# Patient Record
Sex: Male | Born: 1965 | Race: White | Hispanic: No | Marital: Married | Smoking: Current every day smoker
Health system: Southern US, Community
[De-identification: ages and names within clinical notes are randomized; demographics above are authoritative.]

## PROBLEM LIST (undated history)

## (undated) DIAGNOSIS — F419 Anxiety disorder, unspecified: Secondary | ICD-10-CM

## (undated) DIAGNOSIS — G4700 Insomnia, unspecified: Secondary | ICD-10-CM

## (undated) HISTORY — DX: Insomnia, unspecified: G47.00

## (undated) HISTORY — DX: Anxiety disorder, unspecified: F41.9

---

## 1999-09-14 ENCOUNTER — Encounter: Payer: Self-pay | Admitting: Family Medicine

## 1999-09-14 ENCOUNTER — Ambulatory Visit (HOSPITAL_COMMUNITY): Admission: RE | Admit: 1999-09-14 | Discharge: 1999-09-14 | Payer: Self-pay | Admitting: Family Medicine

## 2007-11-30 ENCOUNTER — Encounter: Payer: Self-pay | Admitting: Gastroenterology

## 2008-01-06 ENCOUNTER — Ambulatory Visit: Payer: Self-pay | Admitting: Gastroenterology

## 2008-01-06 DIAGNOSIS — R198 Other specified symptoms and signs involving the digestive system and abdomen: Secondary | ICD-10-CM

## 2008-01-10 ENCOUNTER — Ambulatory Visit: Payer: Self-pay | Admitting: Gastroenterology

## 2008-07-29 ENCOUNTER — Emergency Department (HOSPITAL_COMMUNITY): Admission: EM | Admit: 2008-07-29 | Discharge: 2008-07-29 | Payer: Self-pay | Admitting: Emergency Medicine

## 2011-12-22 ENCOUNTER — Ambulatory Visit (HOSPITAL_COMMUNITY)
Admission: RE | Admit: 2011-12-22 | Discharge: 2011-12-22 | Disposition: A | Discharge status: Home / Self Care | Payer: Worker's Compensation | Source: Ambulatory Visit | Attending: Family Medicine | Admitting: Family Medicine

## 2011-12-22 ENCOUNTER — Other Ambulatory Visit: Payer: Self-pay | Admitting: Family Medicine

## 2011-12-22 DIAGNOSIS — R51 Headache: Secondary | ICD-10-CM

## 2011-12-22 DIAGNOSIS — R52 Pain, unspecified: Secondary | ICD-10-CM

## 2011-12-22 DIAGNOSIS — M25519 Pain in unspecified shoulder: Secondary | ICD-10-CM | POA: Insufficient documentation

## 2011-12-22 DIAGNOSIS — S0990XA Unspecified injury of head, initial encounter: Secondary | ICD-10-CM | POA: Insufficient documentation

## 2011-12-22 DIAGNOSIS — X58XXXA Exposure to other specified factors, initial encounter: Secondary | ICD-10-CM | POA: Insufficient documentation

## 2012-09-20 ENCOUNTER — Ambulatory Visit (INDEPENDENT_AMBULATORY_CARE_PROVIDER_SITE_OTHER): Payer: BC Managed Care – PPO | Admitting: Family Medicine

## 2012-09-20 ENCOUNTER — Encounter: Payer: Self-pay | Admitting: Family Medicine

## 2012-09-20 VITALS — BP 150/85 | HR 91 | Temp 97.3°F | Ht 70.0 in | Wt 216.0 lb

## 2012-09-20 DIAGNOSIS — K219 Gastro-esophageal reflux disease without esophagitis: Secondary | ICD-10-CM

## 2012-09-20 DIAGNOSIS — F17200 Nicotine dependence, unspecified, uncomplicated: Secondary | ICD-10-CM

## 2012-09-20 DIAGNOSIS — N529 Male erectile dysfunction, unspecified: Secondary | ICD-10-CM

## 2012-09-20 DIAGNOSIS — Z0000 Encounter for general adult medical examination without abnormal findings: Secondary | ICD-10-CM

## 2012-09-20 DIAGNOSIS — G4700 Insomnia, unspecified: Secondary | ICD-10-CM

## 2012-09-20 LAB — POCT CBC
Granulocyte percent: 29.5 %G — AB (ref 37–80)
HCT, POC: 47.7 % (ref 43.5–53.7)
Hemoglobin: 16 g/dL (ref 14.1–18.1)
Lymph, poc: 3.1 (ref 0.6–3.4)
MCH, POC: 30.3 pg (ref 27–31.2)
MCHC: 33.5 g/dL (ref 31.8–35.4)
MCV: 90.2 fL (ref 80–97)
MPV: 8.3 fL (ref 0–99.8)
POC Granulocyte: 7.1 — AB (ref 2–6.9)
POC LYMPH PERCENT: 29.5 %L (ref 10–50)
Platelet Count, POC: 267 10*3/uL (ref 142–424)
RBC: 5.3 M/uL (ref 4.69–6.13)
RDW, POC: 13.4 %
WBC: 10.6 10*3/uL — AB (ref 4.6–10.2)

## 2012-09-20 LAB — COMPLETE METABOLIC PANEL WITH GFR
ALT: 21 U/L (ref 0–53)
AST: 13 U/L (ref 0–37)
Albumin: 4.3 g/dL (ref 3.5–5.2)
Alkaline Phosphatase: 80 U/L (ref 39–117)
BUN: 6 mg/dL (ref 6–23)
CO2: 29 mEq/L (ref 19–32)
Calcium: 9.5 mg/dL (ref 8.4–10.5)
Chloride: 102 mEq/L (ref 96–112)
Creat: 0.91 mg/dL (ref 0.50–1.35)
GFR, Est African American: >89 mL/min
GFR, Est Non African American: >89 mL/min
Glucose, Bld: 83 mg/dL (ref 70–99)
Potassium: 4.7 mEq/L (ref 3.5–5.3)
Sodium: 140 mEq/L (ref 135–145)
Total Bilirubin: 0.5 mg/dL (ref 0.3–1.2)
Total Protein: 7 g/dL (ref 6.0–8.3)

## 2012-09-20 LAB — LIPID PANEL
Cholesterol: 207 mg/dL — ABNORMAL HIGH (ref 0–200)
HDL: 34 mg/dL — ABNORMAL LOW (ref >39)
LDL Cholesterol: 128 mg/dL — ABNORMAL HIGH (ref 0–99)
Total CHOL/HDL Ratio: 6.1 Ratio
Triglycerides: 224 mg/dL — ABNORMAL HIGH (ref <150)
VLDL: 45 mg/dL — ABNORMAL HIGH (ref 0–40)

## 2012-09-20 NOTE — Patient Instructions (Signed)

## 2012-09-20 NOTE — Progress Notes (Signed)
  Subjective:    Patient ID: Matthew Whitehead, male    DOB: May 26, 1965, 47 y.o.   MRN: [id number]  HPI  This 47 y.o. male presents for evaluation of fatigue and check up.  He has not been seen in over a year  and he has not had any labs.  He c/o low energy.  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  He states he has been having increased anxiety and has sleeping difficulties. He has been having some GERD and also ED sx's.  He is still smoking.  Review of Systems C/o GERD, ED, Insomnia, and fatigue. No chest pain, SOB, HA, dizziness, vision change, N/V, diarrhea, constipation, dysuria, urinary urgency or frequency, myalgias, arthralgias or rash.     Objective:   Physical Exam Vital signs noted  Well developed well nourished male.  HEENT - Head atraumatic Normocephalic                Eyes - PERRLA, Conjuctiva - clear Sclera- Clear EOMI                Ears - EAC's Wnl TM's Wnl Gross Hearing WNL                Nose - Nares patent                 Throat - oropharanx wnl Respiratory - Lungs CTA bilateral Cardiac - RRR S1 and S2 without murmur GI - Abdomen soft Nontender and bowel sounds active x 4 Rectal - Patient defers. Extremities - No edema. Neuro - Grossly intact.       Assessment & Plan:  Routine general medical examination at a health care facility - Plan: POCT CBC, Lipid panel, TSH, PSA, COMPLETE METABOLIC PANEL WITH GFR, Testosterone, Total & Free Direct  Erectile dysfunction - Viagra 100mg  one half to one po qd prn sex #6 w/11rf  GERD (gastroesophageal reflux disease) - Omeprazole 20mg  one po qd #30w/11  Smoking addiction - Discussed quitting smoking.  Wellbutrin XL 150mg  one po qd #30w/11  Insomnia - Trazadone 50mg  one to two po qhs prn insomnia #60w/3rf, Wellbutrin XL 150mg  one po qd #30w/11.  Discussed sleep hygiene.  Fatigue - Probably due to insomnia.  Check labs, follow up in one month.  Spent over 30  minutes with patient in visit.  Follow up in one month.

## 2012-09-20 NOTE — Addendum Note (Signed)
Addended by: Orma Render F on: 09/20/2012 04:09 PM   Modules accepted: Orders

## 2012-09-21 LAB — TESTOSTERONE, TOTAL AND FREE DIRECT MEASURE
Free Testosterone, Direct: 3.2 pg/mL — ABNORMAL LOW (ref 3.8–34.2)
Testosterone: 237 ng/dL — ABNORMAL LOW (ref 300–890)

## 2012-09-21 LAB — PSA: PSA: 4.08 ng/mL — ABNORMAL HIGH (ref <=4.00)

## 2012-09-21 LAB — TSH: TSH: 2.645 u[IU]/mL (ref 0.350–4.500)

## 2012-09-24 ENCOUNTER — Other Ambulatory Visit: Payer: Self-pay | Admitting: Family Medicine

## 2012-09-24 DIAGNOSIS — R972 Elevated prostate specific antigen [PSA]: Secondary | ICD-10-CM

## 2012-09-24 DIAGNOSIS — E785 Hyperlipidemia, unspecified: Secondary | ICD-10-CM

## 2012-09-24 MED ORDER — CIPROFLOXACIN HCL 500 MG PO TABS: 500.0000 mg | ORAL_TABLET | Freq: Two times a day (BID) | ORAL | Status: DC

## 2012-09-24 MED ORDER — PRAVASTATIN SODIUM 20 MG PO TABS: 20.0000 mg | ORAL_TABLET | Freq: Every day | ORAL | Status: DC

## 2012-09-27 ENCOUNTER — Other Ambulatory Visit: Payer: Self-pay | Admitting: Family Medicine

## 2012-09-29 ENCOUNTER — Other Ambulatory Visit: Payer: Self-pay | Admitting: Family Medicine

## 2013-02-09 IMAGING — CT CT HEAD W/O CM
1 series · 16 of 30 positions shown, 20 images · non-contrast
Comparison: None.

CLINICAL DATA: Headache post blunt trauma.

CT HEAD WITHOUT CONTRAST
TECHNIQUE: Contiguous axial images were obtained from the base of
the skull through the vertex without contrast.

[Series 2: headtrauma 4.8 h37s · axial · 0.46mm/px · z∈[+1125,+1283]mm · 16 of 36 slices shown, 20 images]
[im 2/36  brain]
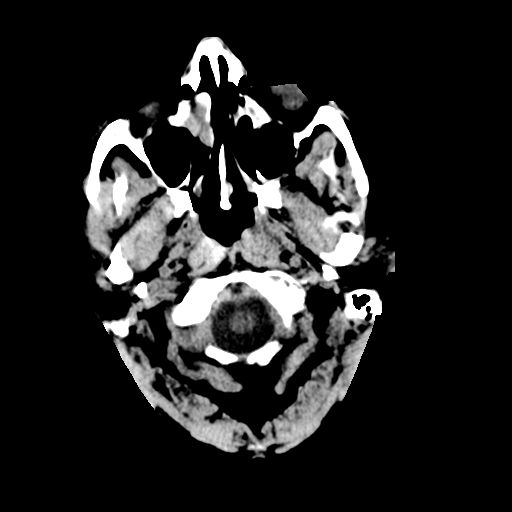
[im 2/36  bone]
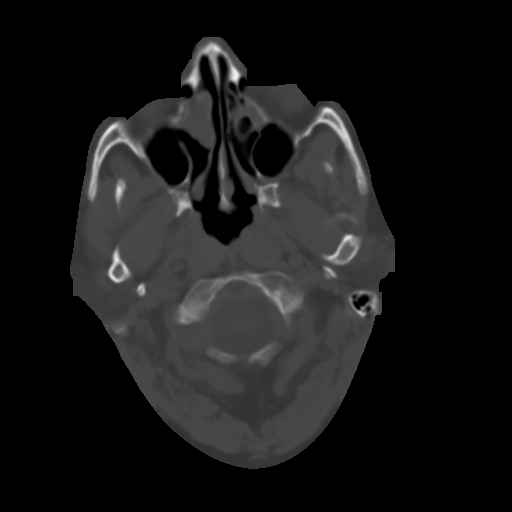
[im 4/36  brain]
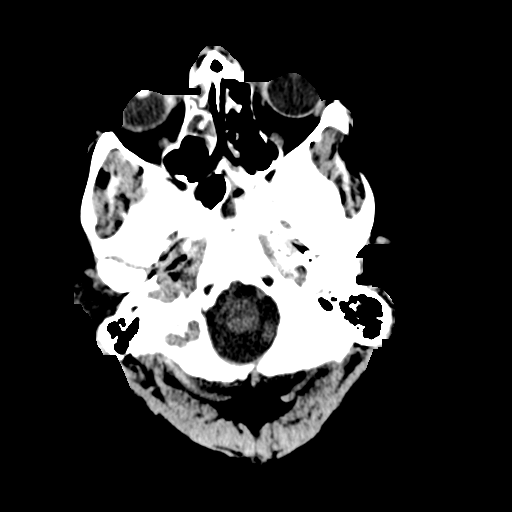
[im 7/36  brain]
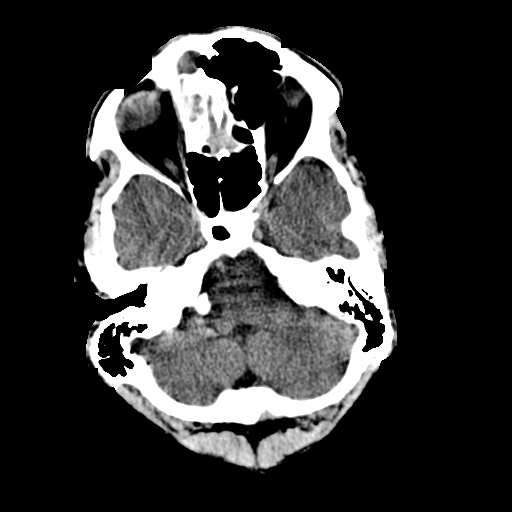
[im 9/36  brain]
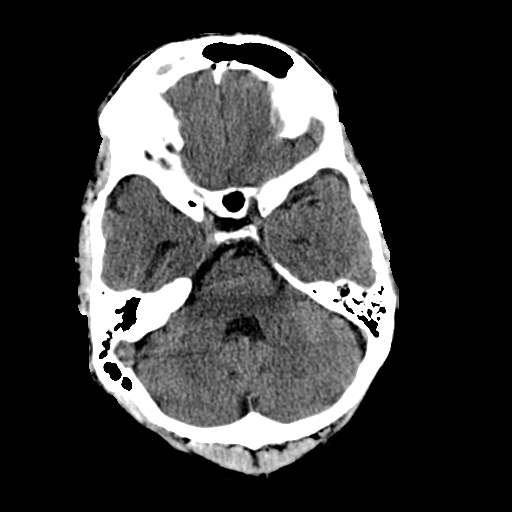
[im 10/36  brain]
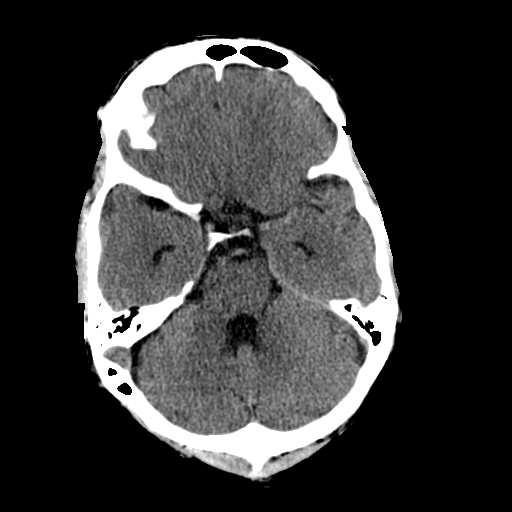
[im 10/36  bone]
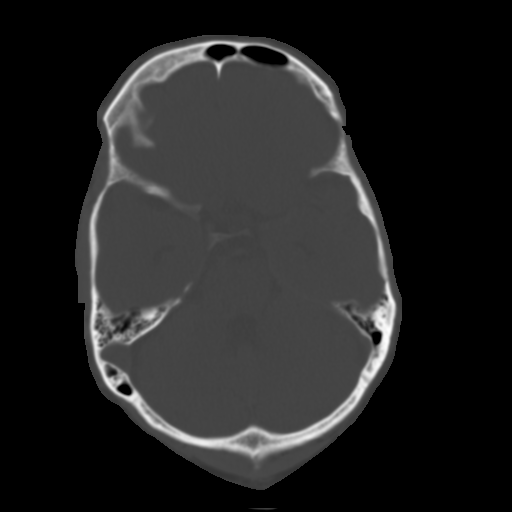
[im 13/36  brain]
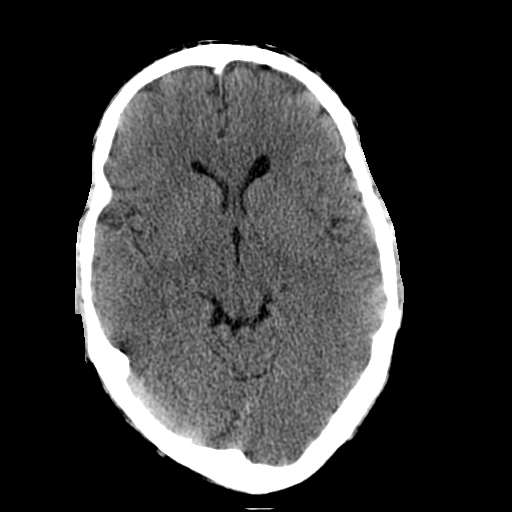
[im 15/36  brain]
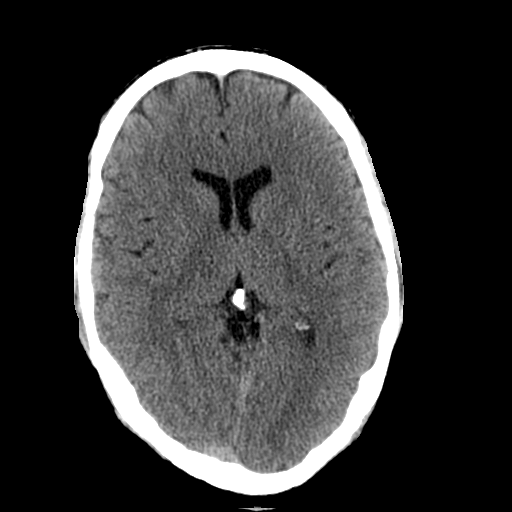
[im 17/36  brain]
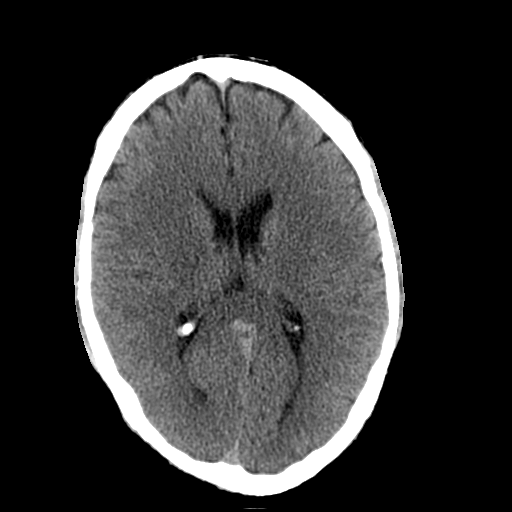
[im 19/36  brain]
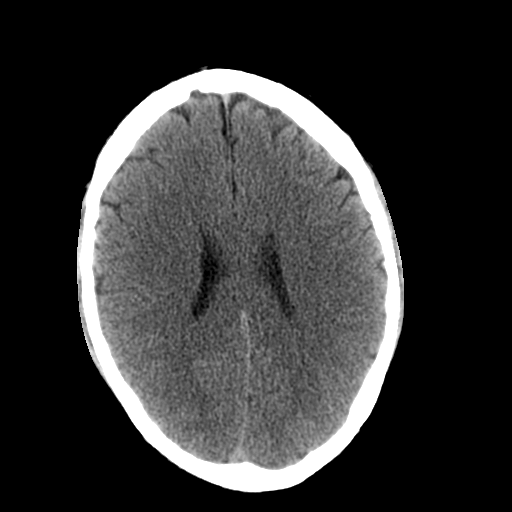
[im 19/36  bone]
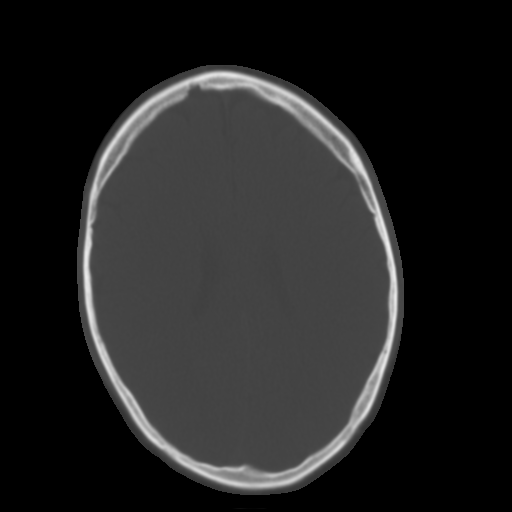
[im 21/36  brain]
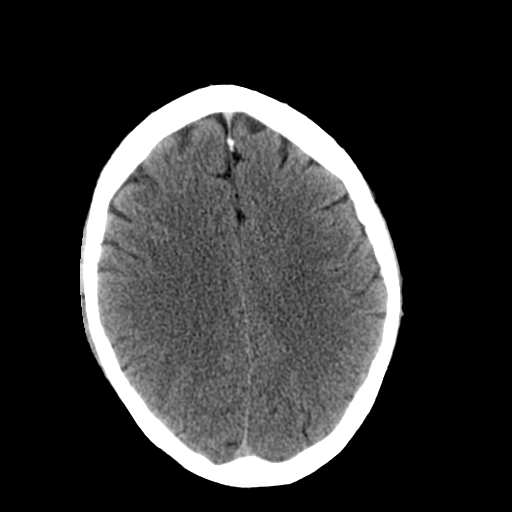
[im 23/36  brain]
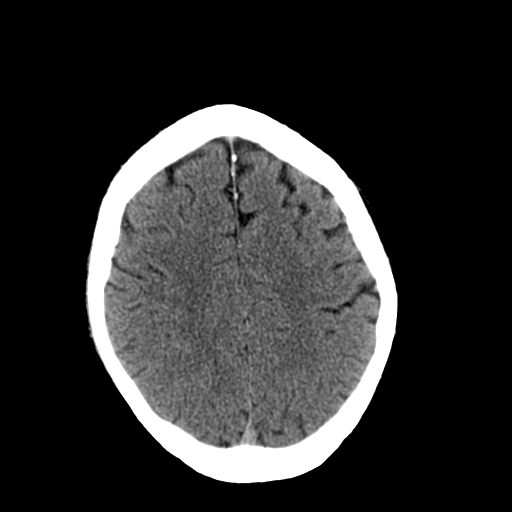
[im 26/36  brain]
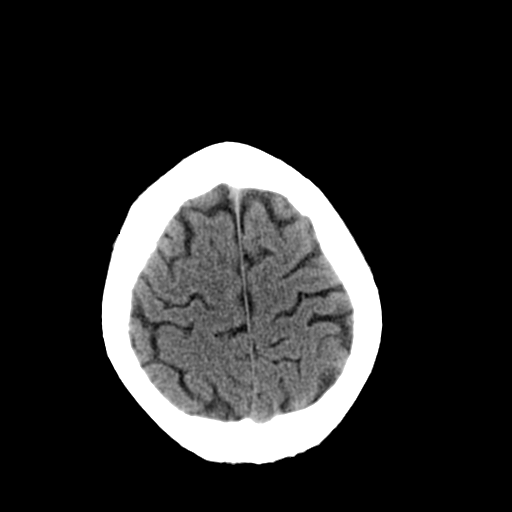
[im 27/36  brain]
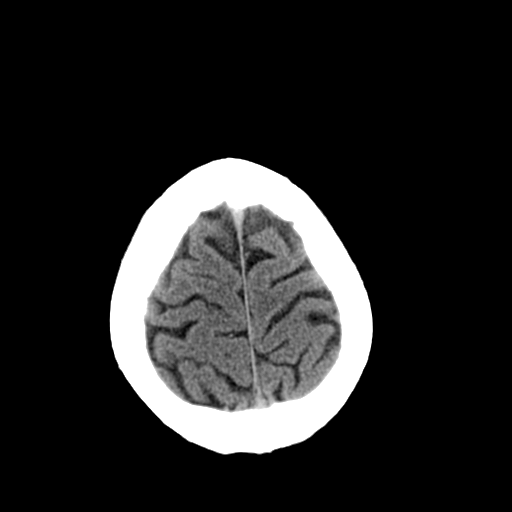
[im 27/36  bone]
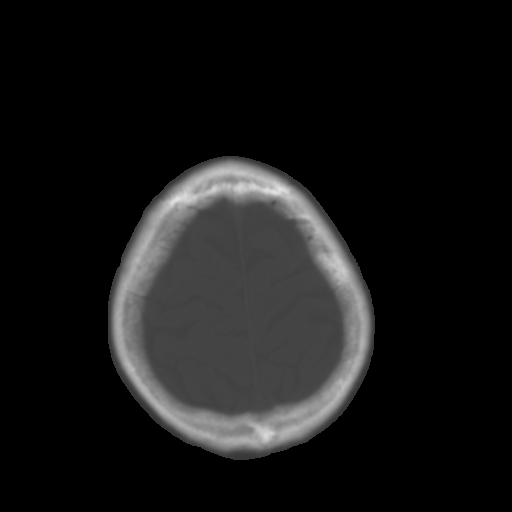
[im 29/36  brain]
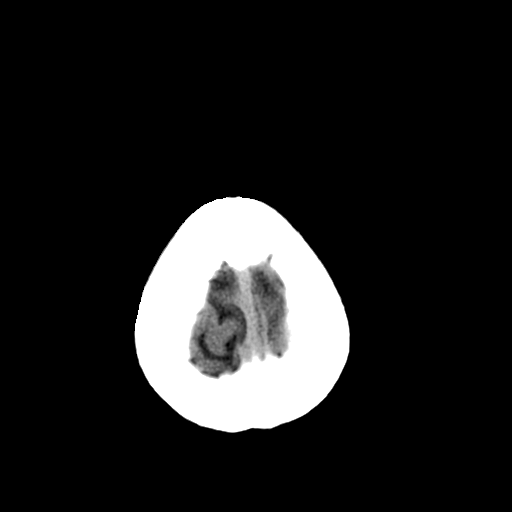
[im 32/36  brain]
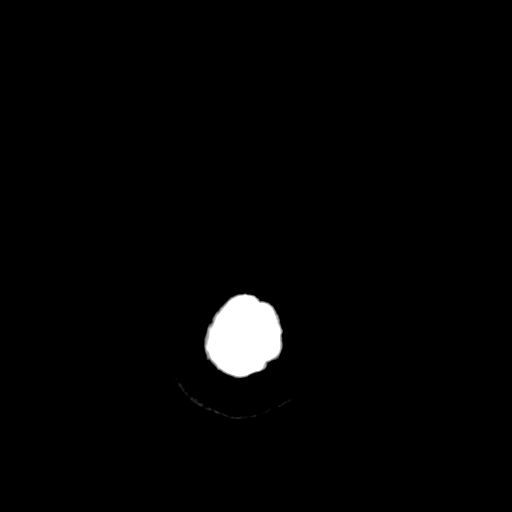
[im 34/36  brain]
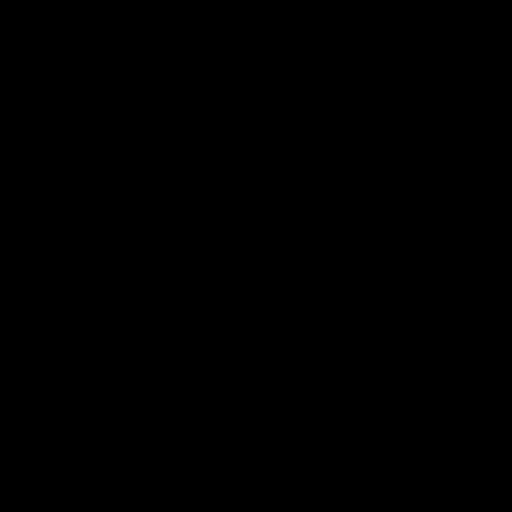

[16 of 30 positions shown; findings below may reference images not displayed]

FINDINGS: Marked mucoperiosteal thickening in the sphenoid sinus on
the left. Patchy opacification of ethmoid air cells, right greater
than left, with dense opacification of the right frontal sinus.
Atherosclerotic and physiologic intracranial calcifications. There
is no evidence of acute intracranial hemorrhage, brain edema, mass
lesion, acute infarction,   mass effect, or midline shift. Acute
infarct may be inapparent on noncontrast CT.  No other intra-axial
abnormalities are seen, and the ventricles and sulci are within
normal limits in size and symmetry.   No abnormal extra-axial fluid
collections or masses are identified.  No significant calvarial
abnormality.
IMPRESSION: 1. Negative for bleed or other acute intracranial process.
2.  Sinus disease as above.

## 2014-03-13 ENCOUNTER — Encounter: Payer: Self-pay | Admitting: Gastroenterology

## 2014-10-19 ENCOUNTER — Encounter: Payer: Self-pay | Admitting: Gastroenterology

## 2014-10-20 ENCOUNTER — Encounter: Payer: Self-pay | Admitting: Family Medicine

## 2014-10-20 ENCOUNTER — Ambulatory Visit (INDEPENDENT_AMBULATORY_CARE_PROVIDER_SITE_OTHER): Payer: BLUE CROSS/BLUE SHIELD | Admitting: Family Medicine

## 2014-10-20 VITALS — BP 139/88 | HR 91 | Temp 98.4°F | Ht 70.0 in | Wt 214.0 lb

## 2014-10-20 DIAGNOSIS — R42 Dizziness and giddiness: Secondary | ICD-10-CM | POA: Diagnosis not present

## 2014-10-20 DIAGNOSIS — Z13220 Encounter for screening for lipoid disorders: Secondary | ICD-10-CM | POA: Diagnosis not present

## 2014-10-20 NOTE — Patient Instructions (Signed)

## 2014-10-20 NOTE — Progress Notes (Signed)
BP 139/88 mmHg  Pulse 91  Temp(Src) 98.4 F (36.9 C) (Oral)  Ht  (1.778 m)  Wt 214 lb (97.07 kg)  BMI 30.71 kg/m2  SpO2 99%   Subjective:    Patient ID: Matthew Whitehead, male    DOB: 1965-12-14, 49 y.o.   MRN: [id number]  HPI: Matthew Whitehead is a 49 y.o. male presenting on 10/20/2014 for Nausea and Dizziness   HPI Dizziness Patient has been having spells of dizziness that last between 5 and 25 minutes when they occur. He describes it as more of an off balance and denies any spinning sensation or lightheadedness or syncope. He cannot define a specific thing that triggers it or anything specific he is doing when it comes on. He denies any chest pain, shortness of breath, vision changes, headache. He denies any lightheadedness when he stands up quickly. He denies a spinning sensation when he turns his head to one side or rolls over in bed. He does say that his father had vertigo.  Relevant past medical, surgical, family and social history reviewed and updated as indicated. Interim medical history since our last visit reviewed. Allergies and medications reviewed and updated.  Review of Systems  Constitutional: Negative for fever and chills.  HENT: Negative for ear discharge and ear pain.   Eyes: Negative for discharge and visual disturbance.  Respiratory: Negative for shortness of breath and wheezing.   Cardiovascular: Negative for chest pain and leg swelling.  Gastrointestinal: Negative for abdominal pain, diarrhea and constipation.  Genitourinary: Negative for difficulty urinating.  Musculoskeletal: Negative for back pain and gait problem.  Skin: Negative for rash.  Neurological: Positive for dizziness. Negative for syncope, speech difficulty, weakness, light-headedness, numbness and headaches.  Psychiatric/Behavioral: Negative for confusion, dysphoric mood, decreased concentration and agitation.  All other systems reviewed and are negative.   Per HPI unless specifically  indicated above     Medication List       This list is accurate as of: 10/20/14  2:34 PM.  Always use your most recent med list.               multivitamin with minerals tablet  Take 1 tablet by mouth daily.           Objective:    BP 139/88 mmHg  Pulse 91  Temp(Src) 98.4 F (36.9 C) (Oral)  Ht  (1.778 m)  Wt 214 lb (97.07 kg)  BMI 30.71 kg/m2  SpO2 99%  Wt Readings from Last 3 Encounters:  10/20/14 214 lb (97.07 kg)  09/20/12 216 lb (97.977 kg)  01/06/08 208 lb 6.1 oz (94.521 kg)    Physical Exam  Constitutional: He is oriented to person, place, and time. He appears well-developed and well-nourished. No distress.  HENT:  Right Ear: External ear normal.  Left Ear: External ear normal.  Nose: Nose normal.  Mouth/Throat: Oropharynx is clear and moist. No oropharyngeal exudate.  Eyes: Conjunctivae and EOM are normal. Pupils are equal, round, and reactive to light. Right eye exhibits no discharge. No scleral icterus.  Neck: Normal range of motion. No thyromegaly present.  Cardiovascular: Normal rate, regular rhythm, normal heart sounds and intact distal pulses.   No murmur heard. Pulmonary/Chest: Effort normal and breath sounds normal. No respiratory distress. He has no wheezes.  Abdominal: He exhibits no distension.  Musculoskeletal: Normal range of motion. He exhibits no edema.  Lymphadenopathy:    He has no cervical adenopathy.  Neurological: He is alert and oriented  to person, place, and time. He is not disoriented. He displays normal reflexes. No cranial nerve deficit or sensory deficit. He exhibits normal muscle tone. Coordination and gait normal.  Skin: Skin is warm and dry. No rash noted. He is not diaphoretic.  Psychiatric: He has a normal mood and affect. His behavior is normal.  Vitals reviewed.   Results for orders placed or performed in visit on 09/20/12  COMPLETE METABOLIC PANEL WITH GFR  Result Value Ref Range   Sodium 140 135 - 145 mEq/L    Potassium 4.7 3.5 - 5.3 mEq/L   Chloride 102 96 - 112 mEq/L   CO2 29 19 - 32 mEq/L   Glucose, Bld 83 70 - 99 mg/dL   BUN 6 6 - 23 mg/dL   Creat 0.91 0.50 - 1.35 mg/dL   Total Bilirubin 0.5 0.3 - 1.2 mg/dL   Alkaline Phosphatase 80 39 - 117 U/L   AST 13 0 - 37 U/L   ALT 21 0 - 53 U/L   Total Protein 7.0 6.0 - 8.3 g/dL   Albumin 4.3 3.5 - 5.2 g/dL   Calcium 9.5 8.4 - 10.5 mg/dL   GFR, Est African American >89 mL/min   GFR, Est Non African American >89 mL/min  Lipid panel  Result Value Ref Range   Cholesterol 207 (H) 0 - 200 mg/dL   Triglycerides 224 (H) <150 mg/dL   HDL 34 (L) >39 mg/dL   Total CHOL/HDL Ratio 6.1 Ratio   VLDL 45 (H) 0 - 40 mg/dL   LDL Cholesterol 128 (H) 0 - 99 mg/dL  TSH  Result Value Ref Range   TSH 2.645 0.350 - 4.500 uIU/mL  PSA  Result Value Ref Range   PSA 4.08 (H) <=4.00 ng/mL  Testosterone, Total & Free Direct  Result Value Ref Range   Testosterone 237 (L) 300 - 890 ng/dL   Free Testosterone, Direct 3.2 (L) 3.8 - 34.2 pg/mL  POCT CBC  Result Value Ref Range   WBC 10.6 (A) 4.6 - 10.2 K/uL   Lymph, poc 3.1 0.6 - 3.4   POC LYMPH PERCENT 29.5 10 - 50 %L   POC Granulocyte 7.1 (A) 2 - 6.9   Granulocyte percent 29.5 (A) 37 - 80 %G   RBC 5.3 4.69 - 6.13 M/uL   Hemoglobin 16.0 14.1 - 18.1 g/dL   HCT, POC 47.7 43.5 - 53.7 %   MCV 90.2 80 - 97 fL   MCH, POC 30.3 27 - 31.2 pg   MCHC 33.5 31.8 - 35.4 g/dL   RDW, POC 13.4 %   Platelet Count, POC 267.0 142 - 424 K/uL   MPV 8.3 0 - 99.8 fL      Assessment & Plan:   Problem List Items Addressed This Visit      Other   Dizziness - Primary   Relevant Orders   Ambulatory referral to ENT   BMP8+EGFR   Thyroid Panel With TSH    Other Visit Diagnoses    Screening for lipoid disorders        Relevant Orders    Lipid panel        Follow up plan: Return in about 4 weeks (around 11/17/2014), or if symptoms worsen or fail to improve.  Caryl Pina, MD [facility] 10/20/2014, 2:34 PM

## 2015-07-04 ENCOUNTER — Ambulatory Visit (INDEPENDENT_AMBULATORY_CARE_PROVIDER_SITE_OTHER): Payer: BLUE CROSS/BLUE SHIELD | Admitting: Family Medicine

## 2015-07-04 ENCOUNTER — Encounter: Payer: Self-pay | Admitting: Family Medicine

## 2015-07-04 VITALS — BP 131/82 | HR 86 | Temp 98.2°F | Ht 70.0 in | Wt 221.0 lb

## 2015-07-04 DIAGNOSIS — G5612 Other lesions of median nerve, left upper limb: Secondary | ICD-10-CM | POA: Diagnosis not present

## 2015-07-04 MED ORDER — PREDNISONE 20 MG PO TABS: ORAL_TABLET | ORAL | Status: DC

## 2015-07-04 NOTE — Progress Notes (Signed)
BP 131/82 mmHg  Pulse 86  Temp(Src) 98.2 F (36.8 C) (Oral)  Ht  (1.778 m)  Wt 221 lb (100.245 kg)  BMI 31.71 kg/m2   Subjective:    Patient ID: Matthew Whitehead, male    DOB: [birth year], 50 y.o.   MRN: [id number]  HPI: Matthew Whitehead is a 50 y.o. male presenting on 07/04/2015 for Left arm and shoulder pain   HPI Left shoulder pain radiating down into first 2 fingers of left hand. Patient has been having issues with tingling and numbness in the first 2 fingers on left hand that is coming from upper shoulder and extending down. He describes it as a feeling like it's asleep. He denies any loss of strength or grip. He denies any fevers or chills or skin changes. He denies any specific trauma that he knows of. He has never had this before in his life.  Relevant past medical, surgical, family and social history reviewed and updated as indicated. Interim medical history since our last visit reviewed. Allergies and medications reviewed and updated.  Review of Systems  Constitutional: Negative for fever and chills.  HENT: Negative for ear discharge and ear pain.   Eyes: Negative for discharge and visual disturbance.  Respiratory: Negative for shortness of breath and wheezing.   Cardiovascular: Negative for chest pain and leg swelling.  Gastrointestinal: Negative for abdominal pain, diarrhea and constipation.  Genitourinary: Negative for difficulty urinating.  Musculoskeletal: Positive for arthralgias. Negative for back pain and gait problem.  Skin: Negative for color change and rash.  Neurological: Positive for numbness. Negative for syncope, weakness, light-headedness and headaches.  All other systems reviewed and are negative.   Per HPI unless specifically indicated above     Medication List       This list is accurate as of: 07/04/15 11:36 AM.  Always use your most recent med list.               predniSONE 20 MG tablet  Commonly known as:  DELTASONE  2 po at same  time daily for 5 days           Objective:    BP 131/82 mmHg  Pulse 86  Temp(Src) 98.2 F (36.8 C) (Oral)  Ht  (1.778 m)  Wt 221 lb (100.245 kg)  BMI 31.71 kg/m2  Wt Readings from Last 3 Encounters:  07/04/15 221 lb (100.245 kg)  10/20/14 214 lb (97.07 kg)  09/20/12 216 lb (97.977 kg)    Physical Exam  Constitutional: He is oriented to person, place, and time. He appears well-developed and well-nourished. No distress.  Eyes: Conjunctivae and EOM are normal. Pupils are equal, round, and reactive to light. Right eye exhibits no discharge. No scleral icterus.  Cardiovascular: Normal rate, regular rhythm, normal heart sounds and intact distal pulses.   No murmur heard. Pulmonary/Chest: Effort normal and breath sounds normal. No respiratory distress. He has no wheezes.  Musculoskeletal: Normal range of motion. He exhibits no edema.  Numbness in the tips of his first and second finger on left hand, unable to elicit with Tinel's or Phalen's, unable to elicit at the elbow either. Unable to elicit with neck range of motion or compression.  Neurological: He is alert and oriented to person, place, and time. Coordination normal.  Skin: Skin is warm and dry. No rash noted. He is not diaphoretic.  Psychiatric: He has a normal mood and affect. His behavior is normal.  Nursing note and vitals reviewed.  Assessment & Plan:       Problem List Items Addressed This Visit    None    Visit Diagnoses    Median neuropathy, left    -  Primary    Tingling and numbness in first and second fingers on left hand, give prednisone, if returns will discuss sending for nerve testing    Relevant Medications    predniSONE (DELTASONE) 20 MG tablet        Follow up plan: Return if symptoms worsen or fail to improve.  Counseling provided for all of the vaccine components No orders of the defined types were placed in this encounter.    Arville CareJoshua Dettinger, MD [facility] 07/04/2015, 11:36 AM

## 2015-07-09 ENCOUNTER — Telehealth: Payer: Self-pay | Admitting: Family Medicine

## 2015-07-09 DIAGNOSIS — R42 Dizziness and giddiness: Secondary | ICD-10-CM

## 2015-07-09 DIAGNOSIS — M542 Cervicalgia: Secondary | ICD-10-CM

## 2015-07-09 NOTE — Telephone Encounter (Signed)
Yes please go ahead and do referral to neurology

## 2015-07-10 ENCOUNTER — Telehealth: Payer: Self-pay | Admitting: Family Medicine

## 2015-07-11 ENCOUNTER — Ambulatory Visit (INDEPENDENT_AMBULATORY_CARE_PROVIDER_SITE_OTHER): Payer: BLUE CROSS/BLUE SHIELD

## 2015-07-11 ENCOUNTER — Ambulatory Visit (INDEPENDENT_AMBULATORY_CARE_PROVIDER_SITE_OTHER): Payer: BLUE CROSS/BLUE SHIELD | Admitting: Family Medicine

## 2015-07-11 ENCOUNTER — Encounter: Payer: Self-pay | Admitting: Family Medicine

## 2015-07-11 ENCOUNTER — Telehealth: Payer: Self-pay | Admitting: Family Medicine

## 2015-07-11 VITALS — BP 150/87 | HR 99 | Temp 97.8°F | Ht 70.0 in | Wt 222.0 lb

## 2015-07-11 DIAGNOSIS — R202 Paresthesia of skin: Secondary | ICD-10-CM | POA: Diagnosis not present

## 2015-07-11 MED ORDER — PREDNISONE 10 MG PO TABS: ORAL_TABLET | ORAL | Status: AC

## 2015-07-11 MED ORDER — BETAMETHASONE SOD PHOS & ACET 6 (3-3) MG/ML IJ SUSP: 6.0000 mg | Freq: Once | INTRAMUSCULAR | Status: AC

## 2015-07-11 NOTE — Telephone Encounter (Signed)
Pt has an appt scheduled with Dr.Stacks today at 12:15.

## 2015-07-11 NOTE — Telephone Encounter (Signed)
Patient already had a note

## 2015-07-11 NOTE — Telephone Encounter (Signed)
Patient was seen today 5/10 and received injection

## 2015-07-11 NOTE — Progress Notes (Signed)
Subjective:  Patient ID: Matthew Whitehead, male    DOB: March 09, 1965  Age: 50 y.o. MRN: [id number]  CC: neuropathy   HPI Matthew Whitehead presents for 3 weeks of tingling down left arm from shoulder. It makes his thumb and index finger numb intermittently as well. He calls it a sensation and denies pain, but the sensation is 10/10 at the shoulder. 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 Took prednisone. Felt it didn't help, but when he DCed the med, sx got worse.    History Matthew Whitehead has a past medical history of Anxiety and Insomnia.    He has no past surgical history on file.   His family history includes COPD in his mother; Coronary artery disease in his father; Hypertension in his father; Lung cancer in his mother.He reports that he has been smoking Cigarettes.  He has a 52.5 pack-year smoking history. He has never used smokeless tobacco. He reports that he does not drink alcohol or use illicit drugs.    ROS Review of Systems  Constitutional: Negative for fever, chills, diaphoresis and unexpected weight change.  HENT: Negative for congestion, hearing loss, rhinorrhea and sore throat.   Eyes: Negative for visual disturbance.  Respiratory: Negative for cough and shortness of breath.   Cardiovascular: Negative for chest pain.  Gastrointestinal: Negative for abdominal pain, diarrhea and constipation.  Genitourinary: Negative for dysuria and flank pain.  Musculoskeletal: Negative for joint swelling and arthralgias.  Skin: Negative for rash.  Neurological: Negative for dizziness and headaches.  Psychiatric/Behavioral: Negative for sleep disturbance and dysphoric mood.    Objective:  BP 150/87 mmHg  Pulse 99  Temp(Src) 97.8 F (36.6 C) (Oral)  Ht 5\' 10"  (1.778 m)  Wt 222 lb (100.699 kg)  BMI 31.85 kg/m2  SpO2 99%  BP Readings from Last 3 Encounters:  07/11/15 150/87  07/04/15 131/82  10/20/14 139/88    Wt Readings from Last 3 Encounters:    07/11/15 222 lb (100.699 kg)  07/04/15 221 lb (100.245 kg)  10/20/14 214 lb (97.07 kg)     Physical Exam  Constitutional: He is oriented to person, place, and time. He appears well-developed and well-nourished. No distress.  HENT:  Head: Normocephalic and atraumatic.  Right Ear: External ear normal.  Left Ear: External ear normal.  Nose: Nose normal.  Mouth/Throat: Oropharynx is clear and moist.  Eyes: Conjunctivae and EOM are normal. Pupils are equal, round, and reactive to light.  Neck: Normal range of motion. Neck supple. No thyromegaly present.  Cardiovascular: Normal rate, regular rhythm and normal heart sounds.   No murmur heard. Pulmonary/Chest: Effort normal and breath sounds normal. No respiratory distress. He has no wheezes. He has no rales.  Abdominal: Soft. Bowel sounds are normal. He exhibits no distension. There is no tenderness.  Lymphadenopathy:    He has no cervical adenopathy.  Neurological: He is alert and oriented to person, place, and time. He has normal reflexes.  Skin: Skin is warm and dry.  Psychiatric: He has a normal mood and affect. His behavior is normal. Judgment and thought content normal.     Lab Results  Component Value Date   WBC 10.6* 09/20/2012   HGB 16.0 09/20/2012   HCT 47.7 09/20/2012   GLUCOSE 83 09/20/2012   CHOL 207* 09/20/2012   TRIG 224* 09/20/2012   HDL 34* 09/20/2012   LDLCALC 128* 09/20/2012   ALT 21 09/20/2012   AST 13 09/20/2012  NA 140 09/20/2012   K 4.7 09/20/2012   CL 102 09/20/2012   CREATININE 0.91 09/20/2012   BUN 6 09/20/2012   CO2 29 09/20/2012   TSH 2.645 09/20/2012   PSA 4.08* 09/20/2012    Ct Head Wo Contrast  12/22/2011  *RADIOLOGY REPORT* Clinical Data: Headache post blunt trauma. CT HEAD WITHOUT CONTRAST Technique:  Contiguous axial images were obtained from the base of the skull through the vertex without contrast. Comparison: None. Findings: Marked mucoperiosteal thickening in the sphenoid sinus on  the left. Patchy opacification of ethmoid air cells, right greater than left, with dense opacification of the right frontal sinus. Atherosclerotic and physiologic intracranial calcifications. There is no evidence of acute intracranial hemorrhage, brain edema, mass lesion, acute infarction,   mass effect, or midline shift. Acute infarct may be inapparent on noncontrast CT.  No other intra-axial abnormalities are seen, and the ventricles and sulci are within normal limits in size and symmetry.   No abnormal extra-axial fluid collections or masses are identified.  No significant calvarial abnormality. IMPRESSION: 1. Negative for bleed or other acute intracranial process. 2.  Sinus disease as above. Original Report Authenticated By: Osa Craver, M.D.   Dg Shoulder Left  12/22/2011  *RADIOLOGY REPORT* Clinical Data: Left shoulder pain/injury LEFT SHOULDER - 2+ VIEW Comparison: None. Findings: No fracture or dislocation is seen. The joint spaces are preserved. The visualized soft tissues are unremarkable. Visualized left lung is clear. IMPRESSION: No fracture or dislocation is seen. Original Report Authenticated By: Charline Bills, M.D.    Assessment & Plan:   [name] was seen today for neuropathy.  Diagnoses and all orders for this visit:  Paresthesias -     DG Shoulder Left; Future -     Nerve conduction test; Future -     betamethasone acetate-betamethasone sodium phosphate (CELESTONE) injection 6 mg; Inject 1 mL (6 mg total) into the muscle once.  Other orders -     predniSONE (DELTASONE) 10 MG tablet; Take 5 daily for 3 days followed by 4,3,2 and 1 for 3 days each.  XR- nml shoulder I have discontinued [name]'s predniSONE. I am also having him start on predniSONE. We will continue to administer betamethasone acetate-betamethasone sodium phosphate.  Meds ordered this encounter  Medications  . predniSONE (DELTASONE) 10 MG tablet    Sig: Take 5 daily for 3 days followed by 4,3,2  and 1 for 3 days each.    Dispense:  45 tablet    Refill:  0  . betamethasone acetate-betamethasone sodium phosphate (CELESTONE) injection 6 mg    Sig:      Follow-up: Return in about 2 weeks (around 07/25/2015) for Dettinger.  Mechele Claude, M.D.

## 2015-07-11 NOTE — Telephone Encounter (Signed)
Yes given him Depo-Medrol 80 mg IM 1

## 2015-07-19 ENCOUNTER — Telehealth: Payer: Self-pay | Admitting: Family Medicine

## 2015-07-20 ENCOUNTER — Telehealth: Payer: Self-pay | Admitting: Family Medicine

## 2015-07-20 NOTE — Telephone Encounter (Signed)
Oh sorry I forgot comment on that, please tell him to take it with food in the morning. If it is still making him sick to his stomach and have him take Zantac 30 minutes before.

## 2015-07-20 NOTE — Telephone Encounter (Signed)
Inform the patient that they can always call the neurologist office and see if they have any cancellations to move that appointment up. We can send this to [facility] or [facility] as well to have them call one more time just to see if there is any earlier appointments available

## 2015-07-20 NOTE — Telephone Encounter (Signed)
Please address Prednisone making him sick

## 2015-07-20 NOTE — Telephone Encounter (Signed)
What should I tell him about the prednisone making him sick? Should he stop taking it? Please advise.

## 2015-07-20 NOTE — Telephone Encounter (Signed)
Pt aware and voiced understanding 

## 2015-08-01 ENCOUNTER — Encounter: Payer: Self-pay | Admitting: Gastroenterology

## 2015-08-13 ENCOUNTER — Ambulatory Visit: Payer: Self-pay | Admitting: Neurology

## 2016-08-29 IMAGING — CR DG SHOULDER 2+V*L*
3 series · 3 of 3 positions shown · non-contrast
Comparison: 12/22/2011

CLINICAL DATA: Left shoulder pain

EXAM:
LEFT SHOULDER - 2+ VIEW

[view not recorded (1 of 3)]
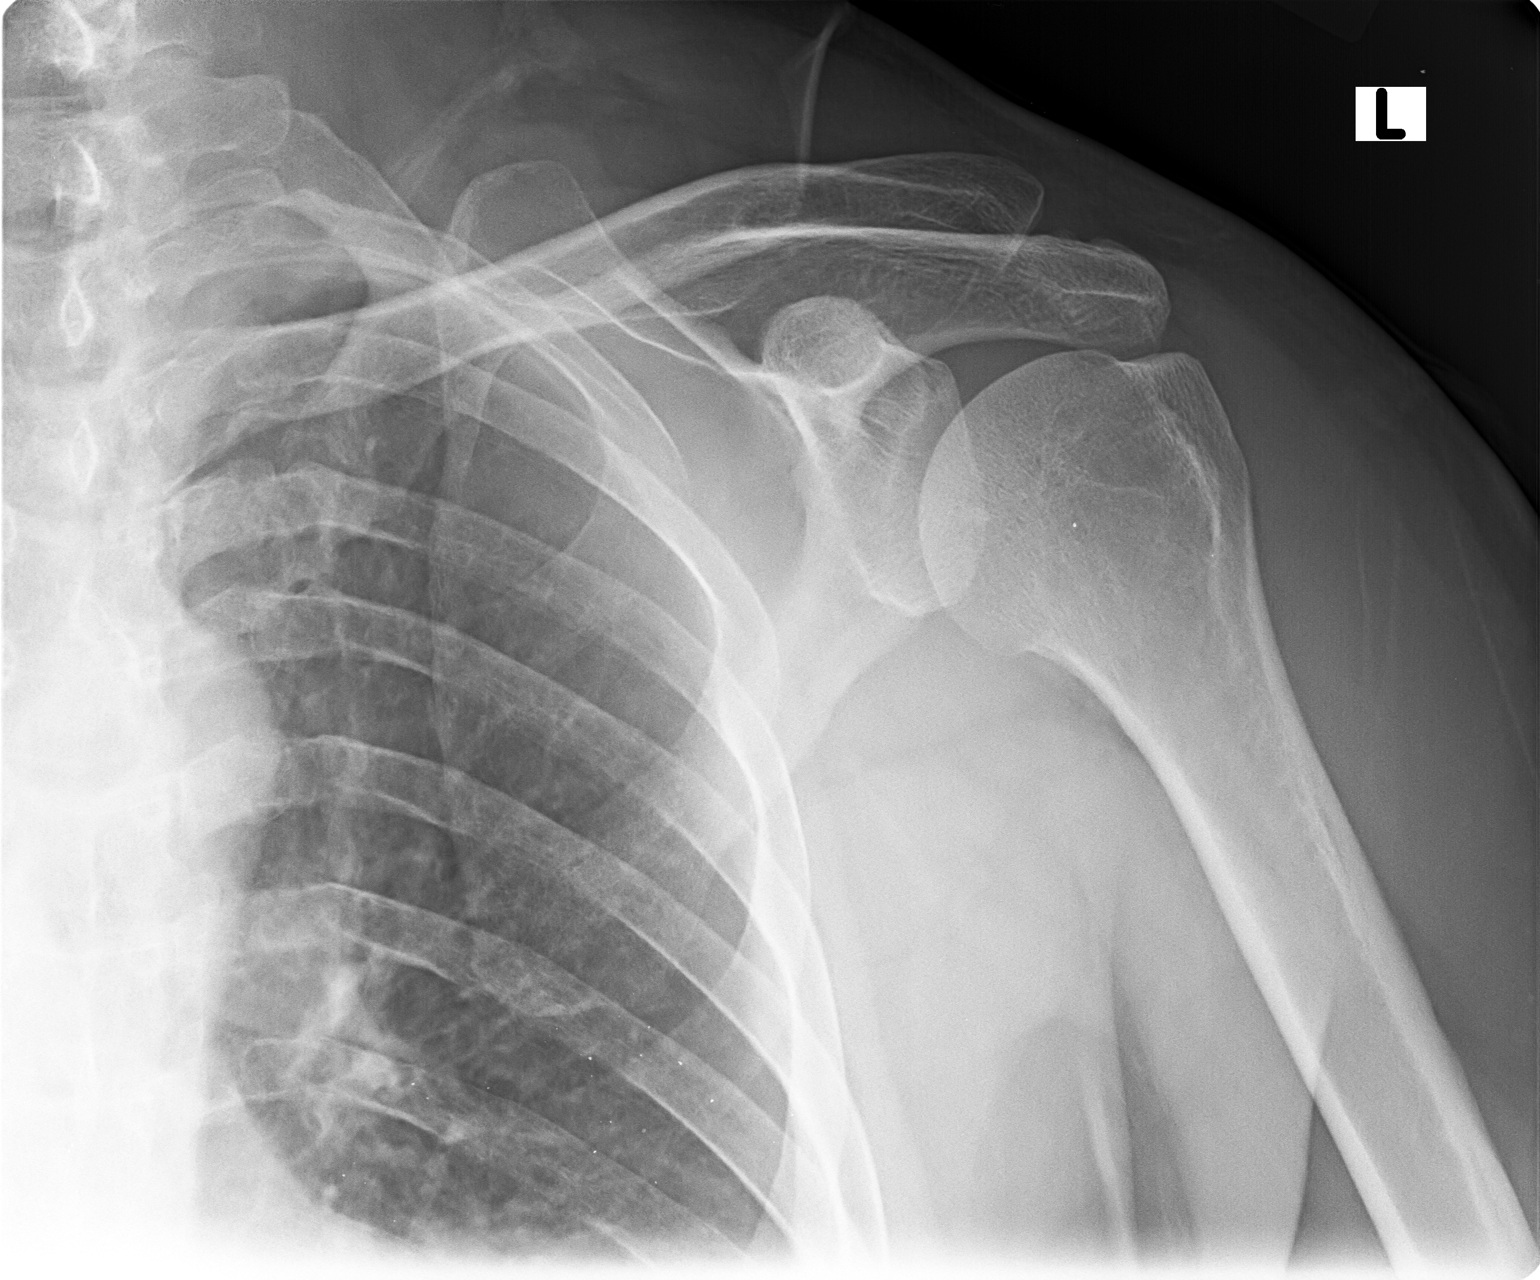

[view not recorded (2 of 3)]
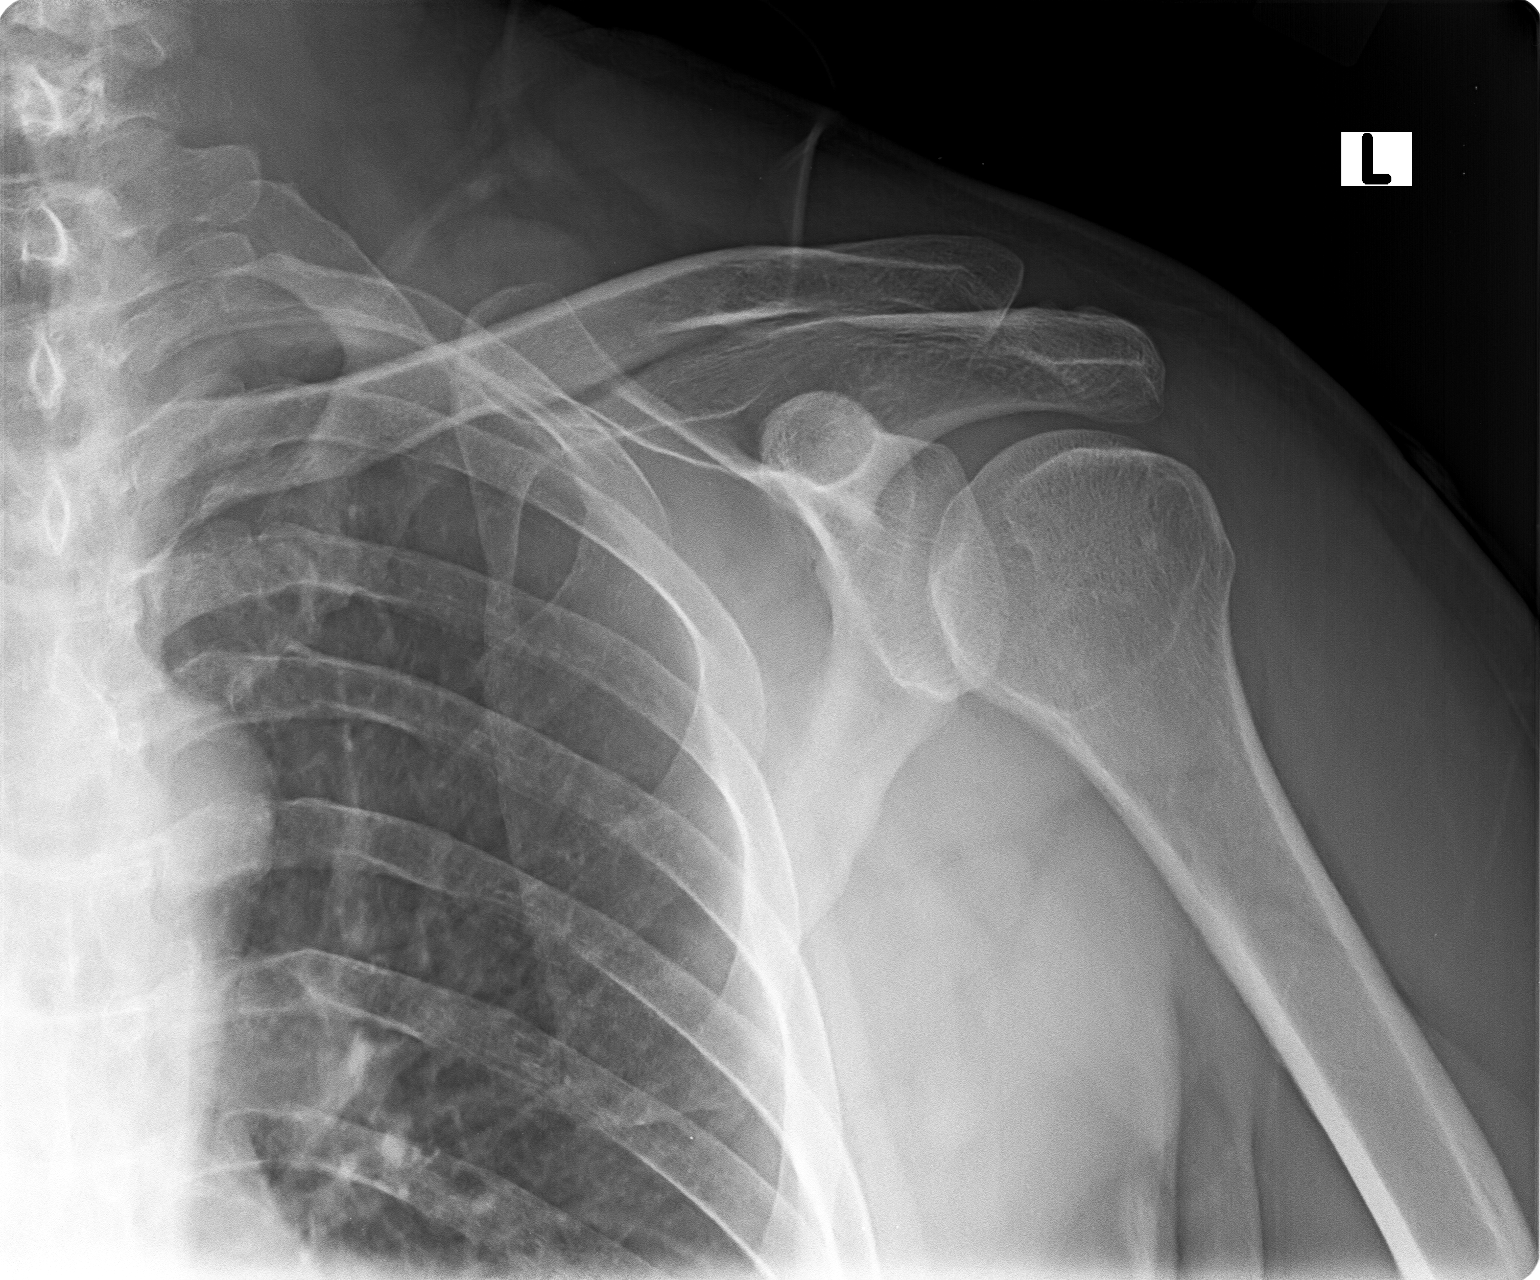

[view not recorded (3 of 3)]
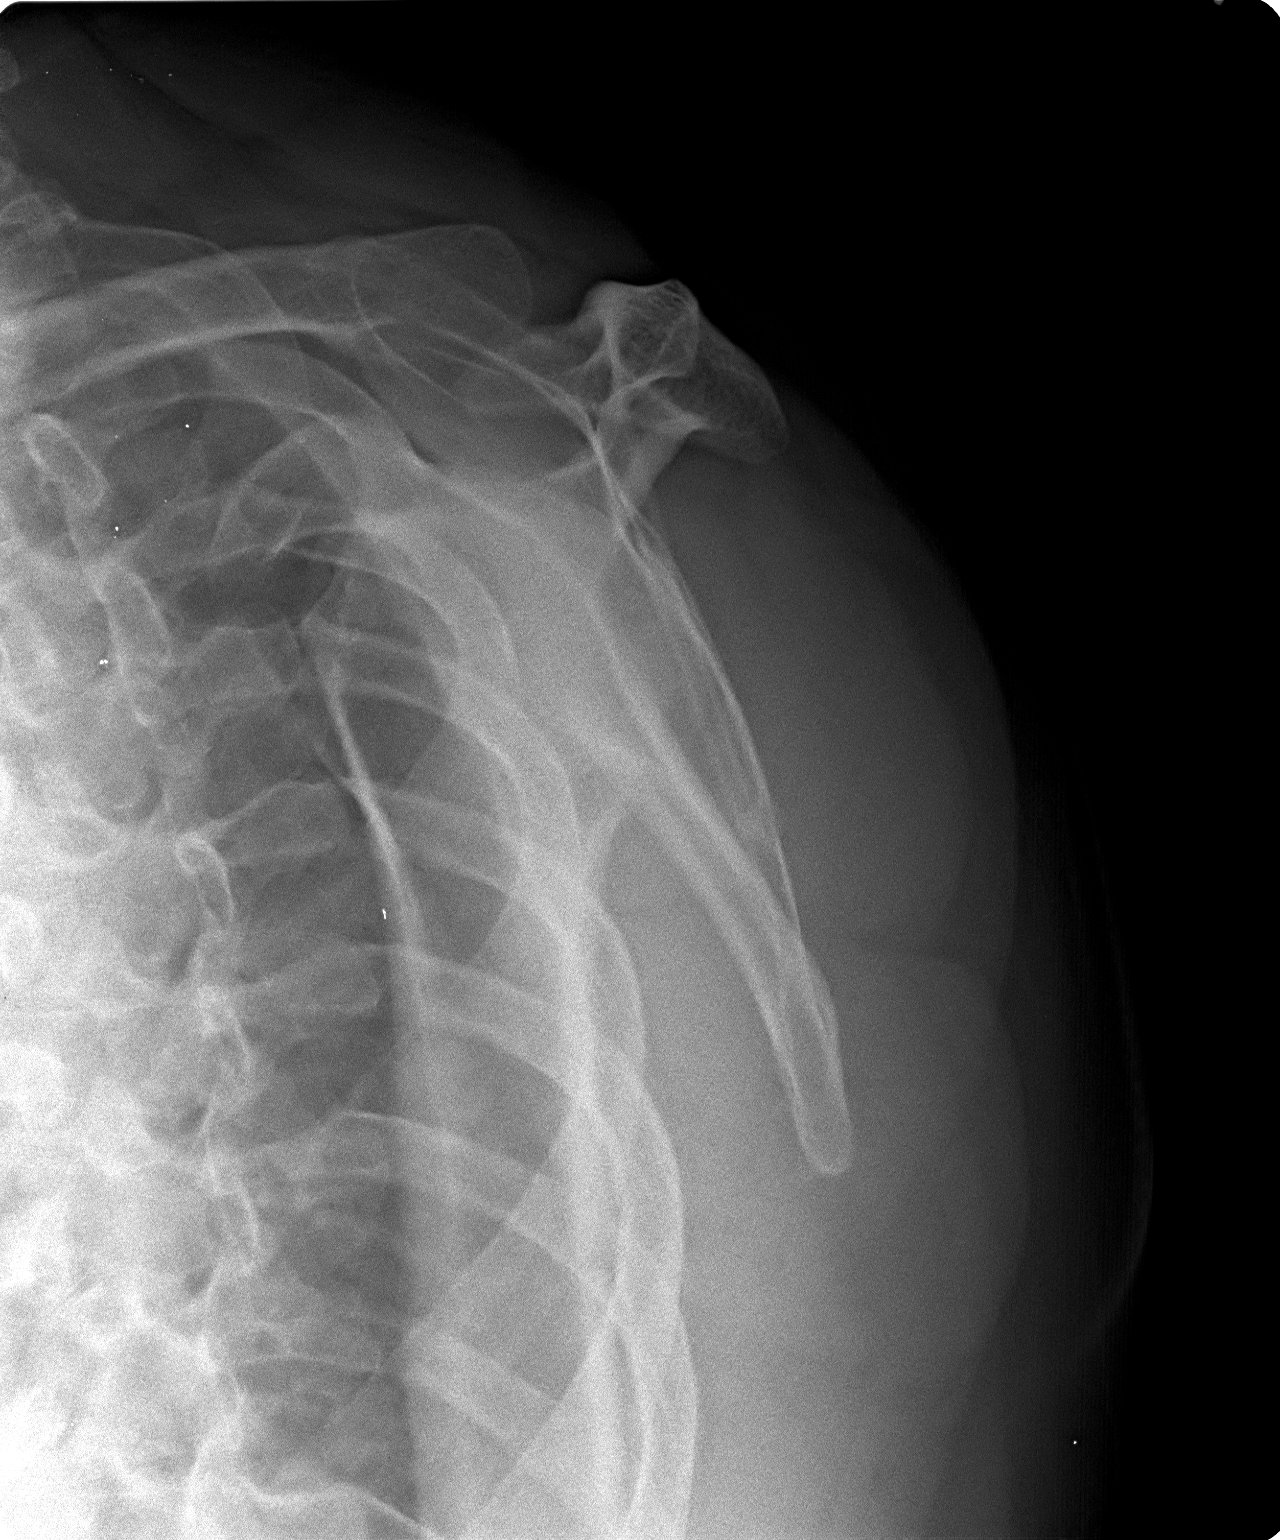

[3 of 3 positions shown; findings below may reference images not displayed]

FINDINGS: No acute fracture. No dislocation.  Unremarkable soft tissues.
IMPRESSION: No acute bony pathology.
